# Patient Record
Sex: Male | Born: 1975 | Race: Black or African American | Hispanic: No | Marital: Single | Smoking: Current every day smoker
Health system: Southern US, Community
[De-identification: ages and names within clinical notes are randomized; demographics above are authoritative.]

---

## 2015-07-18 ENCOUNTER — Emergency Department (HOSPITAL_COMMUNITY): Payer: No Typology Code available for payment source

## 2015-07-18 ENCOUNTER — Encounter (HOSPITAL_COMMUNITY): Payer: Self-pay | Admitting: Radiology

## 2015-07-18 ENCOUNTER — Emergency Department (HOSPITAL_COMMUNITY)
Admission: EM | Admit: 2015-07-18 | Discharge: 2015-07-19 | Disposition: A | Discharge status: Home / Self Care | Payer: No Typology Code available for payment source | Attending: Emergency Medicine | Admitting: Emergency Medicine

## 2015-07-18 DIAGNOSIS — Z720 Tobacco use: Secondary | ICD-10-CM | POA: Insufficient documentation

## 2015-07-18 DIAGNOSIS — Y92410 Unspecified street and highway as the place of occurrence of the external cause: Secondary | ICD-10-CM | POA: Insufficient documentation

## 2015-07-18 DIAGNOSIS — T148 Other injury of unspecified body region: Secondary | ICD-10-CM | POA: Insufficient documentation

## 2015-07-18 DIAGNOSIS — S0990XA Unspecified injury of head, initial encounter: Secondary | ICD-10-CM | POA: Diagnosis present

## 2015-07-18 DIAGNOSIS — S060X0A Concussion without loss of consciousness, initial encounter: Secondary | ICD-10-CM | POA: Diagnosis not present

## 2015-07-18 DIAGNOSIS — S8991XA Unspecified injury of right lower leg, initial encounter: Secondary | ICD-10-CM | POA: Insufficient documentation

## 2015-07-18 DIAGNOSIS — Y9389 Activity, other specified: Secondary | ICD-10-CM | POA: Diagnosis not present

## 2015-07-18 DIAGNOSIS — S060X9A Concussion with loss of consciousness of unspecified duration, initial encounter: Secondary | ICD-10-CM

## 2015-07-18 DIAGNOSIS — Y998 Other external cause status: Secondary | ICD-10-CM | POA: Diagnosis not present

## 2015-07-18 LAB — COMPREHENSIVE METABOLIC PANEL
ALT: 25 U/L (ref 17–63)
AST: 36 U/L (ref 15–41)
Albumin: 4.1 g/dL (ref 3.5–5.0)
Alkaline Phosphatase: 69 U/L (ref 38–126)
Anion gap: 12 (ref 5–15)
BUN: 11 mg/dL (ref 6–20)
CHLORIDE: 101 mmol/L (ref 101–111)
CO2: 24 mmol/L (ref 22–32)
CREATININE: 1.18 mg/dL (ref 0.61–1.24)
Calcium: 9.5 mg/dL (ref 8.9–10.3)
GFR calc Af Amer: >60 mL/min (ref >60)
Glucose, Bld: 107 mg/dL — ABNORMAL HIGH (ref 65–99)
Potassium: 3.8 mmol/L (ref 3.5–5.1)
Sodium: 137 mmol/L (ref 135–145)
Total Bilirubin: 1 mg/dL (ref 0.3–1.2)
Total Protein: 7 g/dL (ref 6.5–8.1)

## 2015-07-18 LAB — CBC
HCT: 43.4 % (ref 39.0–52.0)
Hemoglobin: 15.3 g/dL (ref 13.0–17.0)
MCH: 31.1 pg (ref 26.0–34.0)
MCHC: 35.3 g/dL (ref 30.0–36.0)
MCV: 88.2 fL (ref 78.0–100.0)
PLATELETS: 204 10*3/uL (ref 150–400)
RBC: 4.92 MIL/uL (ref 4.22–5.81)
RDW: 12.4 % (ref 11.5–15.5)
WBC: 5.8 10*3/uL (ref 4.0–10.5)

## 2015-07-18 LAB — SAMPLE TO BLOOD BANK

## 2015-07-18 LAB — PROTIME-INR
INR: 1.15 (ref 0.00–1.49)
PROTHROMBIN TIME: 14.9 s (ref 11.6–15.2)

## 2015-07-18 LAB — ETHANOL

## 2015-07-18 LAB — CDS SEROLOGY

## 2015-07-18 MED ORDER — ONDANSETRON HCL 4 MG/2ML IJ SOLN: 4.0000 mg | Freq: Once | INTRAMUSCULAR | Status: DC

## 2015-07-18 MED ORDER — TETANUS-DIPHTH-ACELL PERTUSSIS 5-2.5-18.5 LF-MCG/0.5 IM SUSP
0.5000 mL | Freq: Once | INTRAMUSCULAR | Status: AC
  Administered 2015-07-18: 0.5 mL via INTRAMUSCULAR
  Filled 2015-07-18: qty 0.5

## 2015-07-18 MED ORDER — SODIUM CHLORIDE 0.9 % IV BOLUS (SEPSIS)
1000.0000 mL | Freq: Once | INTRAVENOUS | Status: AC
  Administered 2015-07-18: 1000 mL via INTRAVENOUS

## 2015-07-18 MED ORDER — MORPHINE SULFATE (PF) 4 MG/ML IV SOLN: 4.0000 mg | Freq: Once | INTRAVENOUS | Status: DC
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
## 2015-07-18 NOTE — ED Notes (Signed)
Patient transported to X-ray 

## 2015-07-18 NOTE — ED Notes (Signed)
Abrasions cleaned with water Neosporin applied
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
## 2015-07-19 MED ORDER — METHOCARBAMOL 500 MG PO TABS: 500.0000 mg | ORAL_TABLET | Freq: Two times a day (BID) | ORAL | Status: AC

## 2015-07-19 MED ORDER — METHOCARBAMOL 500 MG PO TABS: 500.0000 mg | ORAL_TABLET | Freq: Two times a day (BID) | ORAL | Status: DC

## 2015-07-19 MED ORDER — HYDROCODONE-ACETAMINOPHEN 5-325 MG PO TABS: 2.0000 | ORAL_TABLET | ORAL | Status: AC | PRN

## 2015-07-19 MED ORDER — IBUPROFEN 200 MG PO TABS: 400.0000 mg | ORAL_TABLET | Freq: Four times a day (QID) | ORAL | Status: AC | PRN

## 2015-07-19 MED ORDER — HYDROCODONE-ACETAMINOPHEN 5-325 MG PO TABS: 1.0000 | ORAL_TABLET | ORAL | Status: DC | PRN

## 2015-07-19 MED ORDER — IBUPROFEN 400 MG PO TABS: 400.0000 mg | ORAL_TABLET | Freq: Four times a day (QID) | ORAL | Status: DC | PRN

## 2015-07-19 NOTE — Discharge Instructions (Signed)
Bike Safety, Adult Riding a bike is a fun activity that is good for your health. However, it is important that you know how to stay safe while biking. WHAT DO I NEED TO WEAR WHILE BIKING?  Helmet A helmet is the most important piece of equipment that you can wear to protect yourself while riding a bike. Make sure that you:  Always wear a helmet when you ride a bike, and make sure that the straps are fastened.  Wear a helmet that is specifically made for biking.  Have a helmet that has been safety-approved. Look for a helmet that has a Midwife) sticker. If you have any questions, ask them at the store where you are buying the helmet. Never buy a used helmet.  Get a new helmet if you get into a bike accident. You should also get a new helmet every five years or sooner.  Have a helmet that is well-ventilated. A helmet will not help to protect you if it does not fit properly. Here are some tips to make sure your helmet fits:  The helmet should sit on top of your head. It should not tip backward or forward.  Find the smallest helmet shell size that fits over your head.  Do not use helmet pads to make a helmet fit if it is too big for your head.  Leave space for about two fingers between your eyebrows and the front brim of the helmet.  The straps should be joined under each of your ears at the jawbone.  The buckle should be snug when your mouth is completely open. Other equipment Make sure that you wear:  Shoes that are safe for biking, such as sneakers. The shoes should not slip on the pedals. Do not ride a bike barefoot.  Do not wear flip flops.  Do not wear cleats.  Do not wear shoes with heels.  Pants that are fitted, if you are wearing pants. If your pants are too loose or wide at the bottom, they can get stuck in the bike chain.  Bright or fluorescent clothes. This helps you to be visible. Avoid dark-colored clothes.  Reflective tape is also  helpful.  Clothes that are comfortable and appropriate for the weather. WHAT RULES DO I NEED TO KNOW TO BIKE SAFELY? You need to know to:  Obey all traffic signs. These include:  Stop signs.  Traffic lights.  Bike in the same direction as the cars. Never bike against traffic.  Use hand signals, including signals to:  Make a left-hand turn.  Make a right-hand turn.  Stop.  Never listen to headphones while riding a bike.  Never text or talk on a cell phone while riding a bike.  Never stand up while riding a bike.  Always stop and check for pedestrians, cars, and any other traffic whenever you start a bike ride. Always look in both directions.  Never have more than one adult on a bike. If you are carrying a child in a bike seat, make sure that the bike seat or carrier has been safety-approved.  Be careful. Watch for:  Cars opening up doors.  Cars leaving driveways.  Pedestrians.  Road hazards, such as potholes or puddles.  Ride in single file if you are riding in a group.  Walk your bike across busy intersections.  Pass on the left side, if you are passing a pedestrian or another biker. Call out that you are on the left so the  pedestrian or biker knows that you are there.  Never attach your bike to another moving object, vehicle, or pet.  Always hold the handlebars with both hands.  Always use bike lanes or paths when they are available. WHAT SHOULD I CHECK BEFORE RIDING A BIKE? You should always check that:  Your helmet fits properly. This is important because straps can loosen over time.  The bike's front and back brakes work.  The bike's tires are inflated properly.  The seat is at the correct level.  The chain is not loose, rusted, or making cracking or grinding noises when in use. WHEN SHOULD I AVOID RIDING A BIKE? Do not ride a bike:  If the weather conditions are unsafe, such as during a thunderstorm or if the roads are icy.  If it is dark  outside. If you must ride at night, make sure that you wear bright clothing and have reflectors or lights in the front and back of the bike.  If you have been drinking alcohol or using drugs.  If your health care provider has advised you not to ride a bike.   This information is not intended to replace advice given to you by your health care provider. Make sure you discuss any questions you have with your health care provider.   Document Released: 12/02/2003 Document Revised: 10/02/2014 Document Reviewed: 08/05/2014 Elsevier Interactive Patient Education 2016 [employer].  Concussion, Adult A concussion is a brain injury. It is caused by:  A hit to the head.  A quick and sudden movement (jolt) of the head or neck. A concussion is usually not life threatening. Even so, it can cause serious problems. If you had a concussion before, you may have concussion-like problems after a hit to your head. HOME CARE General Instructions  Follow your doctor's directions carefully.  Take medicines only as told by your doctor.  Only take medicines your doctor says are safe.  Do not drink alcohol until your doctor says it is okay. Alcohol and some drugs can slow down healing. They can also put you at risk for further injury.  If you are having trouble remembering things, write them down.  Try to do one thing at a time if you get distracted easily. For example, do not watch TV while making dinner.  Talk to your family members or close friends when making important decisions.  Follow up with your doctor as told.  Watch your symptoms. Tell others to do the same. Serious problems can sometimes happen after a concussion. Older adults are more likely to have these problems.  Tell your teachers, school nurse, school counselor, coach, Event organiser, or work Production designer, theatre/television/film about your concussion. Tell them about what you can or cannot do. They should watch to see if:  It gets even harder for you to pay  attention or concentrate.  It gets even harder for you to remember things or learn new things.  You need more time than normal to finish things.  You become annoyed (irritable) more than before.  You are not able to deal with stress as well.  You have more problems than before.  Rest. Make sure you:  Get plenty of sleep at night.  Go to sleep early.  Go to bed at the same time every day. Try to wake up at the same time.  Rest during the day.  Take naps when you feel tired.  Limit activities where you have to think a lot or concentrate. These include:  Doing homework.  Doing work related to a job.  Watching TV.  Using the computer. Returning To Your Regular Activities Return to your normal activities slowly, not all at once. You must give your body and brain enough time to heal.   Do not play sports or do other athletic activities until your doctor says it is okay.  Ask your doctor when you can drive, ride a bicycle, or work other vehicles or machines. Never do these things if you feel dizzy.  Ask your doctor about when you can return to work or school. Preventing Another Concussion It is very important to avoid another brain injury, especially before you have healed. In rare cases, another injury can lead to permanent brain damage, brain swelling, or death. The risk of this is greatest during the first 7-10 days after your injury. Avoid injuries by:   Wearing a seat belt when riding in a car.  Not drinking too much alcohol.  Avoiding activities that could lead to a second concussion (such as contact sports).  Wearing a helmet when doing activities like:  Biking.  Skiing.  Skateboarding.  Skating.  Making your home safer by:  Removing things from the floor or stairways that could make you trip.  Using grab bars in bathrooms and handrails by stairs.  Placing non-slip mats on floors and in bathtubs.  Improve lighting in dark areas. GET HELP IF:  It  gets even harder for you to pay attention or concentrate.  It gets even harder for you to remember things or learn new things.  You need more time than normal to finish things.  You become annoyed (irritable) more than before.  You are not able to deal with stress as well.  You have more problems than before.  You have problems keeping your balance.  You are not able to react quickly when you should. Get help if you have any of these problems for more than 2 weeks:   Lasting (chronic) headaches.  Dizziness or trouble balancing.  Feeling sick to your stomach (nausea).  Seeing (vision) problems.  Being affected by noises or light more than normal.  Feeling sad, low, down in the dumps, blue, gloomy, or empty (depressed).  Mood changes (mood swings).  Feeling of fear or nervousness about what may happen (anxiety).  Feeling annoyed.  Memory problems.  Problems concentrating or paying attention.  Sleep problems.  Feeling tired all the time. GET HELP RIGHT AWAY IF:   You have bad headaches or your headaches get worse.  You have weakness (even if it is in one hand, leg, or part of the face).  You have loss of feeling (numbness).  You feel off balance.  You keep throwing up (vomiting).  You feel tired.  One black center of your eye (pupil) is larger than the other.  You twitch or shake violently (convulse).  Your speech is not clear (slurred).  You are more confused, easily angered (agitated), or annoyed than before.  You have more trouble resting than before.  You are unable to recognize people or places.  You have neck pain.  It is difficult to wake you up.  You have unusual behavior changes.  You pass out (lose consciousness). MAKE SURE YOU:   Understand these instructions.  Will watch your condition.  Will get help right away if you are not doing well or get worse.   This information is not intended to replace advice given to you by your  health care provider.  Make sure you discuss any questions you have with your health care provider.   Document Released: 08/30/2009 Document Revised: 10/02/2014 Document Reviewed: 04/03/2013 Elsevier Interactive Patient Education 2016 Elsevier Inc.  Post-Concussion Syndrome Post-concussion syndrome describes the symptoms that can occur after a head injury. These symptoms can last from weeks to months. CAUSES  It is not clear why some head injuries cause post-concussion syndrome. It can occur whether your head injury was mild or severe and whether you were wearing head protection or not.  SIGNS AND SYMPTOMS  Memory difficulties.  Dizziness.  Headaches.  Double vision or blurry vision.  Sensitivity to light.  Hearing difficulties.  Depression.  Tiredness.  Weakness.  Difficulty with concentration.  Difficulty sleeping or staying asleep.  Vomiting.  Poor balance or instability on your feet.  Slow reaction time.  Difficulty learning and remembering things you have heard. DIAGNOSIS  There is no test to determine whether you have post-concussion syndrome. Your health care provider may order an imaging scan of your brain, such as a CT scan, to check for other problems that may be causing your symptoms (such as a severe injury inside your skull). TREATMENT  Usually, these problems disappear over time without medical care. Your health care provider may prescribe medicine to help ease your symptoms. It is important to follow up with a neurologist to evaluate your recovery and address any lingering symptoms or issues. HOME CARE INSTRUCTIONS   Take medicines only as directed by your health care provider. Do not take aspirin. Aspirin can slow blood clotting.  Sleep with your head slightly elevated to help with headaches.  Avoid any situation where there is potential for another head injury. This includes football, hockey, soccer, basketball, martial arts, downhill snow sports,  and horseback riding. Your condition will get worse every time you experience a concussion. You should avoid these activities until you are evaluated by the appropriate follow-up health care providers.  Keep all follow-up visits as directed by your health care provider. This is important. SEEK MEDICAL CARE IF:  You have increased problems paying attention or concentrating.  You have increased difficulty remembering or learning new information.  You need more time to complete tasks or assignments than before.  You have increased irritability or decreased ability to cope with stress.  You have more symptoms than before. Seek medical care if you have any of the following symptoms for more than two weeks after your injury:  Lasting (chronic) headaches.  Dizziness or balance problems.  Nausea.  Vision problems.  Increased sensitivity to noise or light.  Depression or mood swings.  Anxiety or irritability.  Memory problems.  Difficulty concentrating or paying attention.  Sleep problems.  Feeling tired all the time. SEEK IMMEDIATE MEDICAL CARE IF:  You have confusion or unusual drowsiness.  Others find it difficult to wake you up.  You have nausea or persistent, forceful vomiting.  You feel like you are moving when you are not (vertigo). Your eyes may move rapidly back and forth.  You have convulsions or faint.  You have severe, persistent headaches that are not relieved by medicine.  You cannot use your arms or legs normally.  One of your pupils is larger than the other.  You have clear or bloody discharge from your nose or ears.  Your problems are getting worse, not better. MAKE SURE YOU:  Understand these instructions.  Will watch your condition.  Will get help right away if you are not doing well or get worse.  This information is not intended to replace advice given to you by your health care provider. Make sure you discuss any questions you have with  your health care provider.   Document Released: 03/03/2002 Document Revised: 10/02/2014 Document Reviewed: 12/17/2013 Elsevier Interactive Patient Education 2016 [employer]    Emergency Department Resource Guide 1) Find a Doctor and Pay Out of Pocket Although you won't have to find out who is covered by your insurance plan, it is a good idea to ask around and get recommendations. You will then need to call the office and see if the doctor you have chosen will accept you as a new patient and what types of options they offer for patients who are self-pay. Some doctors offer discounts or will set up payment plans for their patients who do not have insurance, but you will need to ask so you aren't surprised when you get to your appointment.  2) Contact Your Local Health Department Not all health departments have doctors that can see patients for sick visits, but many do, so it is worth a call to see if yours does. If you don't know where your local health department is, you can check in your phone book. The CDC also has a tool to help you locate your state's health department, and many state websites also have listings of all of their local health departments.  3) Find a Walk-in Clinic If your illness is not likely to be very severe or complicated, you may want to try a walk in clinic. These are popping up all over the country in pharmacies, drugstores, and shopping centers. They're usually staffed by nurse practitioners or physician assistants that have been trained to treat common illnesses and complaints. They're usually fairly quick and inexpensive. However, if you have serious medical issues or chronic medical problems, these are probably not your best option.  No Primary Care Doctor: - Call Health Connect at  [phone] - they can help you locate a primary care doctor that  accepts your insurance, provides certain services, etc. - Physician Referral Service- [phone]  Chronic Pain  Problems: Organization         Address  Phone   Notes  [facility]  [phone] Patients need to be referred by their primary care doctor.   Medication Assistance: Organization         Address  Phone   Notes  [facility] [address] [place], [place] [id number] [phone] --Must be a resident of [facility] [place] -- Must have NO insurance coverage whatsoever (no Medicaid/ Medicare, etc.) -- The pt. MUST have a primary care doctor that directs their care regularly and follows them in the community   [facility]ist  [phone]   [employer]  [phone]    Agencies that provide inexpensive medical care: Organization         Address  Phone   Notes  [facility]  [phone]   [facility]    [phone]   [facility] [address] [place], [place] [address] [phone]   [facility] [address], [place] [phone]   [facility]    [phone]   [facility]    [phone]   [facility]  [address], [place] Phone:  [phone], Fax:  [phone] Hours of Operation:  9 am -  6 pm, M-F.  Also accepts Medicaid/Medicare and self-pay.  [facility] for [place], [address], [place] Phone: [phone], Fax: [phone]. Hours of Operation:  8:30 am - 5:30 pm, M-F.  Also accepts Medicaid and self-pay.  [facility] [place] [address] [place] Phone: [phone]   [place], [place], [place] [phone], Ext. [phone] Mondays & Thursdays: 7-9 AM.  First 15 patients are seen on a first come, first serve basis.    [place] Providers:  Organization         Address  Phone   Notes  [facility] [address], [place] [phone] Also  accepts self-pay patients.  [facility] [address], [place]  [phone]   [facility], [address], [place] [phone]   [facility] [address] [place] [phone]   Lucianne Lei [address], [place]   [phone] Only accepts [place] Access [place] patients after they have their name applied to their card.   Self-Pay (no insurance) in [facility]:  Organization         Address  Phone   Notes  Sickle Cell Patients, [facility] [place] [phone]   [facility] [place] [phone]   [facility] [place]  [place], [place], [place] [phone]   [facility]/Dr. Osei-Bonsu  [address] [place] or [address], [place] [phone] Phone number for both [place] and [place] locations is the same.  [facility] [address] [place] [phone]   [facility] [address], [place] or [address] [phone]   [facility] [address], [place] [phone], phone; [phone], fax Sees patients 1st and 3rd Saturday of every month.  Must not qualify for public or private insurance (i.e. Medicaid, Medicare, [place] Health Choice, Veterans' Benefits)  Household income should be no more than 200% of the poverty level The clinic cannot treat you if you are pregnant or think you are pregnant  Sexually transmitted diseases are not treated at the clinic.    Dental Care: Organization         Address  Phone  Notes  [facility] of [facility] [place] [phone] Accepts children up to age 29 who are enrolled in [place] or [employer]; pregnant  women with a Medicaid card; and children who have applied for Medicaid or  Health Choice, but were declined, whose parents can pay a reduced fee at time of service.  [facility] Department of [facility]  [address], [place] [phone] Accepts children up to age 61 who are enrolled in [place] or [place]; pregnant women with a Medicaid card; and children who have applied for Medicaid or  Health Choice, but were declined, whose parents can pay a reduced fee at time of service.  [facility]  [place] [phone] Patients are seen by appointment only. Walk-ins are not accepted. [place] will see patients 51 years of age and  older. Monday - Tuesday (8am-5pm) Most Wednesdays (8:30-5pm) $30 per visit, cash only  [facility] Adult [employer] PROGRAM  [address], [facility] [phone] Patients are seen by appointment only. Walk-ins are not accepted. [place] will see patients 82 years of age and older. One Wednesday Evening (Monthly: Volunteer Based).  $30 per visit, cash only  [place]  [phone] for adults; Children under age 57, call Graduate Pediatric Dentistry at [phone]. Children aged 56-14, please call [phone] to request a pediatric application.  Dental services are provided in all areas of dental care including fillings, crowns and bridges, complete and partial dentures, implants, gum treatment, root canals, and extractions. Preventive care is also provided. Treatment is provided to both adults and children. Patients are selected via a lottery and there is often a waiting list.   [facility] [address] [place]  [phone] [email or web address]   [facility] [address] [place], [place] [phone], Ext. [phone] Second and Fourth Thursday of each month, opens at 6:30 AM; Clinic ends at 9 AM.  Patients are  seen on a first-come first-served basis, and a limited number are seen during each clinic.   [facility]  [address] [place], [place] [phone]   Eligibility Requirements You must have lived in [place], [place], or [place] counties for at least the last three months.   You cannot be eligible for state or federal sponsored [employer], including [employer], [place], or [employer].   You generally cannot be eligible for healthcare insurance through your employer.    How to apply: Eligibility screenings are held every Tuesday and Wednesday afternoon from 1:00 pm until 4:00 pm. You do not need an appointment for the interview!  [facility] [address], [place], [place]   [place] Department  [place]  [phone]    Behavioral Health Resources in the Community: Intensive Outpatient Programs Organization         Address  Phone  Notes  Tucker Bradshaw. [address] [place], [place] [phone]   [facility] Outpatient [address], [place], [place]   ADS: [facility] Drug [facility] [address] [place], [place]   [place] [address],  [place], [place] or [phone]   Substance Abuse Resources Organization         Address  Phone  Notes  Alcohol and Drug Services  [phone]   [place]  [phone]   [facility] [place]   Chinita Pester  [phone]   Residential & Outpatient Substance Abuse Program  [phone]   Psychological Services Organization         Address  Phone  Notes  [facility]  [place]  [phone]   [place] [address], [place] [phone] or [phone]    Mobile Crisis  Teams Organization         Address  Phone  Notes  [facility], Mobile Crisis Care Unit  [phone]   [facility]  [address] [place], [place]   [facility] [address] [place] [phone]    Self-Help/Support Groups Organization         Address  Phone             Notes  °[facility] of [place] - variety of support groups  [phone]- [phone] Call for more information  °Narcotics Anonymous (NA), [facility] [address], °[place]  2 meetings at this location  ° °Residential Treatment Programs °Organization         Address  Phone  Notes  °[facility] [address],    °[place]  [phone]   °[facility] ° [address] [address], [place], [place] [phone]   °[facility] [address], [place] [phone] Admissions: 8am-3pm M-F  °[facility] [address]    °[place], [place] [phone]   °[facility] [address], [place], [place] [phone]   °[facility] [address]  °[place], [place] [phone]   °[facility] Int[facility] [address], [place], [place] [phone]   °[facility] [address]  °[place], [place] [phone] or [phone]   °[facility] [address] [place], [place] [phone] Accepts Medicaid  °[facility] [address]  °[place] 1-800-659-3381 Substance Abuse/Addiction Treatment  ° °[facility] °Organization         Address  Phone  Notes  °[facility]  [phone]) [phone]   °Julie Brannon, PhD [address] [place], [place]   ([phone]) [phone] or ([phone]) [phone]   °[facility]   [address] °[place], [place] [phone]) [phone]   °[facility] [address], [place], [place] [phone]  Insurance/Medicaid/sponsorship through [facility]  °[facility] [address]                                    [place], [place] [phone] Therapy/tele-psych/case  °[facility] [address]  ° [place], [place] [phone]    °Dr. Arfeen  ([phone]   °[facility]  [employer] [facility] 1) [address], [place] °2) [address], [place] °3)  [address], [place] [phone]) [phone] °([phone]) [phone] ° °([phone]) [phone]   °[place] [facility] [phone]) [phone] or ([phone]) [phone] (After Hours)    ° ° ° °
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
